# Patient Record
Sex: Male | Born: 1995 | Race: White | Hispanic: No | Marital: Single | Smoking: Never smoker
Health system: Southern US, Community
[De-identification: ages and names within clinical notes are randomized; demographics above are authoritative.]

## PROBLEM LIST (undated history)

## (undated) DIAGNOSIS — F329 Major depressive disorder, single episode, unspecified: Secondary | ICD-10-CM

## (undated) DIAGNOSIS — F32A Depression, unspecified: Secondary | ICD-10-CM

---

## 2013-02-13 ENCOUNTER — Emergency Department: Payer: Self-pay | Admitting: Emergency Medicine

## 2013-02-13 LAB — COMPREHENSIVE METABOLIC PANEL
Alkaline Phosphatase: 99 U/L (ref 98–317)
Anion Gap: 9 (ref 7–16)
BUN: 8 mg/dL (ref 7–18)
Chloride: 110 mmol/L — ABNORMAL HIGH (ref 98–107)
Co2: 24 mmol/L (ref 21–32)
Creatinine: 0.74 mg/dL (ref 0.60–1.30)
EGFR (African American): >60
EGFR (Non-African Amer.): >60
Osmolality: 285 (ref 275–301)
Potassium: 3.5 mmol/L (ref 3.5–5.1)
SGPT (ALT): 20 U/L (ref 12–78)
Sodium: 143 mmol/L (ref 136–145)

## 2013-02-13 LAB — ETHANOL
Ethanol %: 0.174 % — ABNORMAL HIGH (ref 0.000–0.080)
Ethanol: 174 mg/dL

## 2013-02-13 LAB — CBC
HGB: 12.7 g/dL — ABNORMAL LOW (ref 13.0–18.0)
MCH: 25.6 pg — ABNORMAL LOW (ref 26.0–34.0)
MCHC: 33.8 g/dL (ref 32.0–36.0)
RBC: 4.96 10*6/uL (ref 4.40–5.90)
WBC: 16.1 10*3/uL — ABNORMAL HIGH (ref 3.8–10.6)

## 2013-02-16 ENCOUNTER — Ambulatory Visit: Payer: Self-pay | Admitting: Psychiatry

## 2013-02-16 LAB — RAPID URINE DRUG SCREEN, HOSP PERFORMED
Amphetamines, Ur Screen: NEGATIVE (ref ?–1000)
Barbiturates, Ur Screen: NEGATIVE (ref ?–200)
Cocaine Metabolite,Ur ~~LOC~~: NEGATIVE (ref ?–300)
Opiate, Ur Screen: NEGATIVE (ref ?–300)

## 2013-05-25 ENCOUNTER — Ambulatory Visit: Payer: Self-pay | Admitting: Family Medicine

## 2013-08-15 ENCOUNTER — Emergency Department: Payer: Self-pay | Admitting: Emergency Medicine

## 2013-08-15 LAB — URINALYSIS, COMPLETE
BLOOD: NEGATIVE
Bilirubin,UR: NEGATIVE
Glucose,UR: NEGATIVE mg/dL (ref 0–75)
Ketone: NEGATIVE
Leukocyte Esterase: NEGATIVE
Nitrite: NEGATIVE
Ph: 7 (ref 4.5–8.0)
Protein: 30
RBC,UR: 5 /HPF (ref 0–5)
SPECIFIC GRAVITY: 1.025 (ref 1.003–1.030)
WBC UR: 2 /HPF (ref 0–5)

## 2013-08-15 LAB — CBC WITH DIFFERENTIAL/PLATELET
Basophil #: 0.1 10*3/uL (ref 0.0–0.1)
Basophil %: 0.6 %
EOS ABS: 0.4 10*3/uL (ref 0.0–0.7)
Eosinophil %: 3.2 %
HCT: 41.1 % (ref 40.0–52.0)
HGB: 13.5 g/dL (ref 13.0–18.0)
LYMPHS ABS: 2.2 10*3/uL (ref 1.0–3.6)
Lymphocyte %: 16.5 %
MCH: 25.9 pg — ABNORMAL LOW (ref 26.0–34.0)
MCHC: 32.9 g/dL (ref 32.0–36.0)
MCV: 79 fL — ABNORMAL LOW (ref 80–100)
MONO ABS: 0.7 x10 3/mm (ref 0.2–1.0)
Monocyte %: 5.1 %
Neutrophil #: 9.8 10*3/uL — ABNORMAL HIGH (ref 1.4–6.5)
Neutrophil %: 74.6 %
Platelet: 247 10*3/uL (ref 150–440)
RBC: 5.21 10*6/uL (ref 4.40–5.90)
RDW: 15.2 % — AB (ref 11.5–14.5)
WBC: 13.2 10*3/uL — AB (ref 3.8–10.6)

## 2013-08-15 LAB — COMPREHENSIVE METABOLIC PANEL
ALK PHOS: 112 U/L
AST: 23 U/L (ref 10–41)
Albumin: 4.3 g/dL (ref 3.8–5.6)
Anion Gap: 5 — ABNORMAL LOW (ref 7–16)
BUN: 9 mg/dL (ref 7–18)
Bilirubin,Total: 0.9 mg/dL (ref 0.2–1.0)
CO2: 29 mmol/L (ref 21–32)
CREATININE: 0.81 mg/dL (ref 0.60–1.30)
Calcium, Total: 8.8 mg/dL — ABNORMAL LOW (ref 9.0–10.7)
Chloride: 108 mmol/L — ABNORMAL HIGH (ref 98–107)
EGFR (African American): >60
EGFR (Non-African Amer.): >60
Glucose: 115 mg/dL — ABNORMAL HIGH (ref 65–99)
OSMOLALITY: 283 (ref 275–301)
POTASSIUM: 3.9 mmol/L (ref 3.5–5.1)
SGPT (ALT): 19 U/L (ref 12–78)
Sodium: 142 mmol/L (ref 136–145)
TOTAL PROTEIN: 7.3 g/dL (ref 6.4–8.6)

## 2013-08-15 LAB — ETHANOL: Ethanol %: <0.003 % (ref 0.000–0.080)

## 2013-08-15 LAB — LIPASE, BLOOD: Lipase: 185 U/L (ref 73–393)

## 2015-01-29 IMAGING — CR RIGHT MIDDLE FINGER 2+V
1 series · 3 of 3 positions shown · non-contrast
Comparison: No prior .

CLINICAL DATA: Trauma.

EXAM:
RIGHT MIDDLE FINGER 2+V

[Series 1: pa · 0.17mm/px · 3 of 3 slices shown]
[im 1/3]
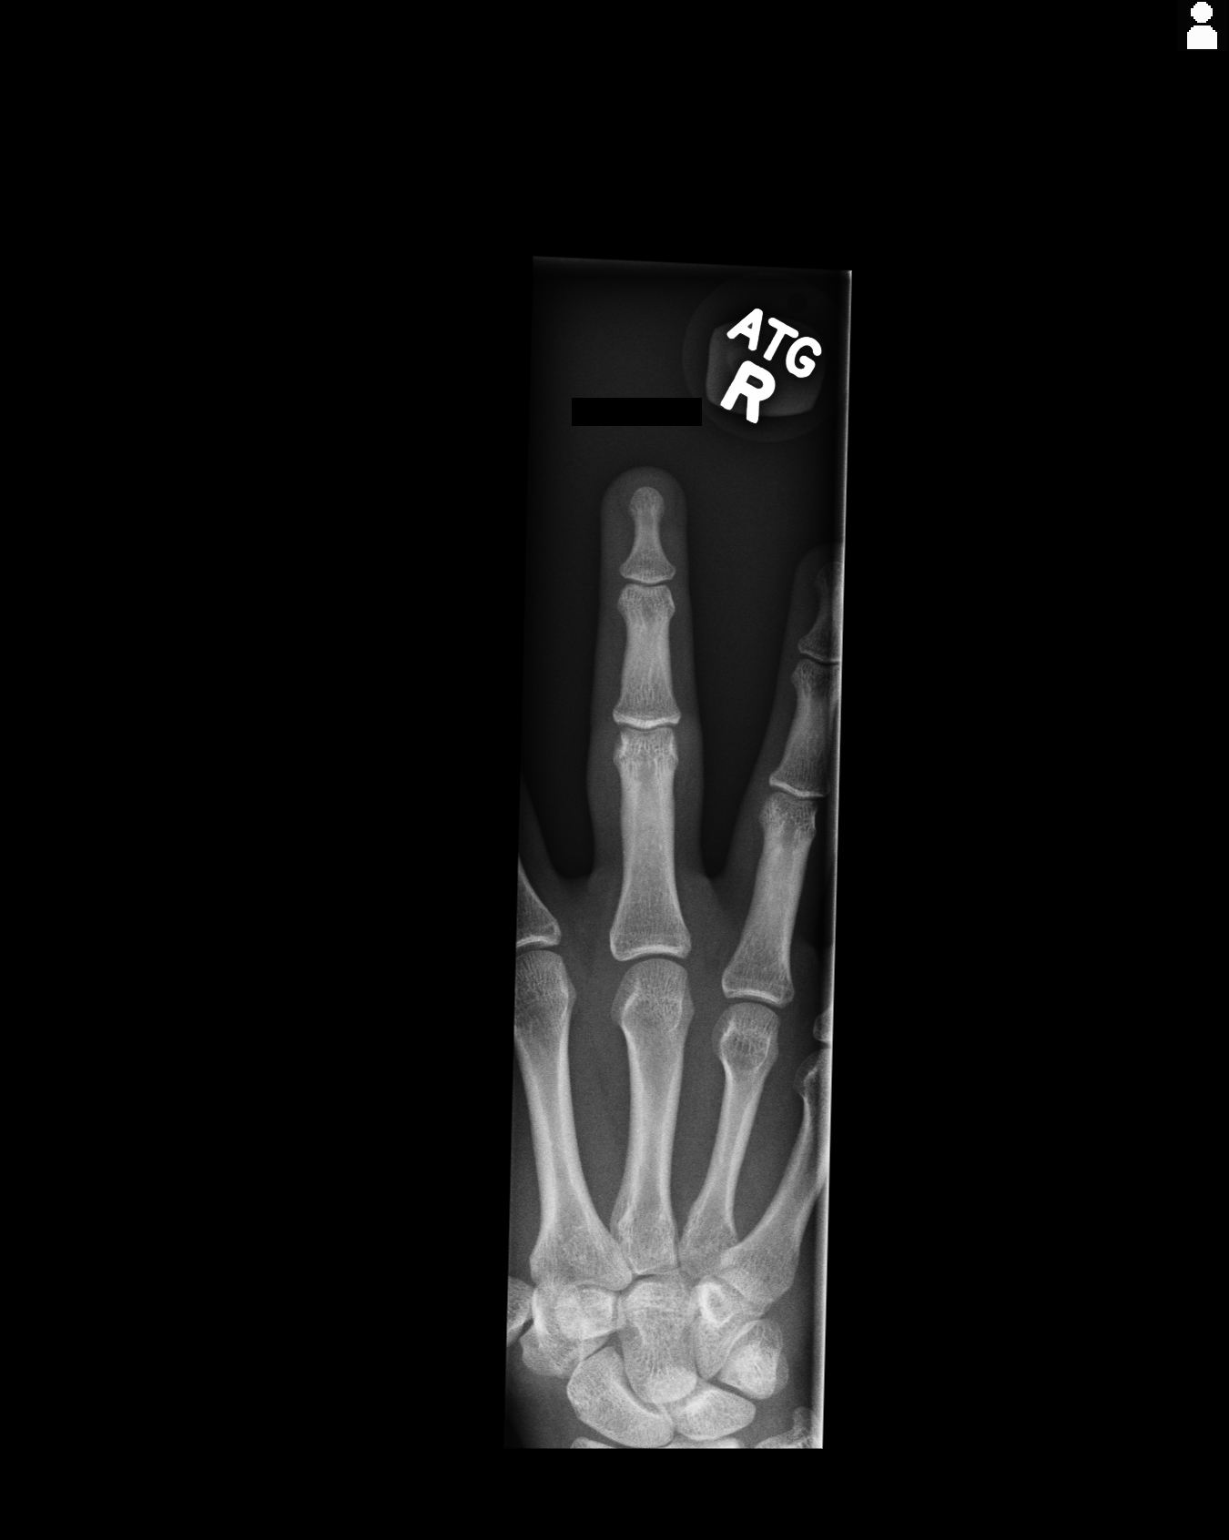
[im 2/3]
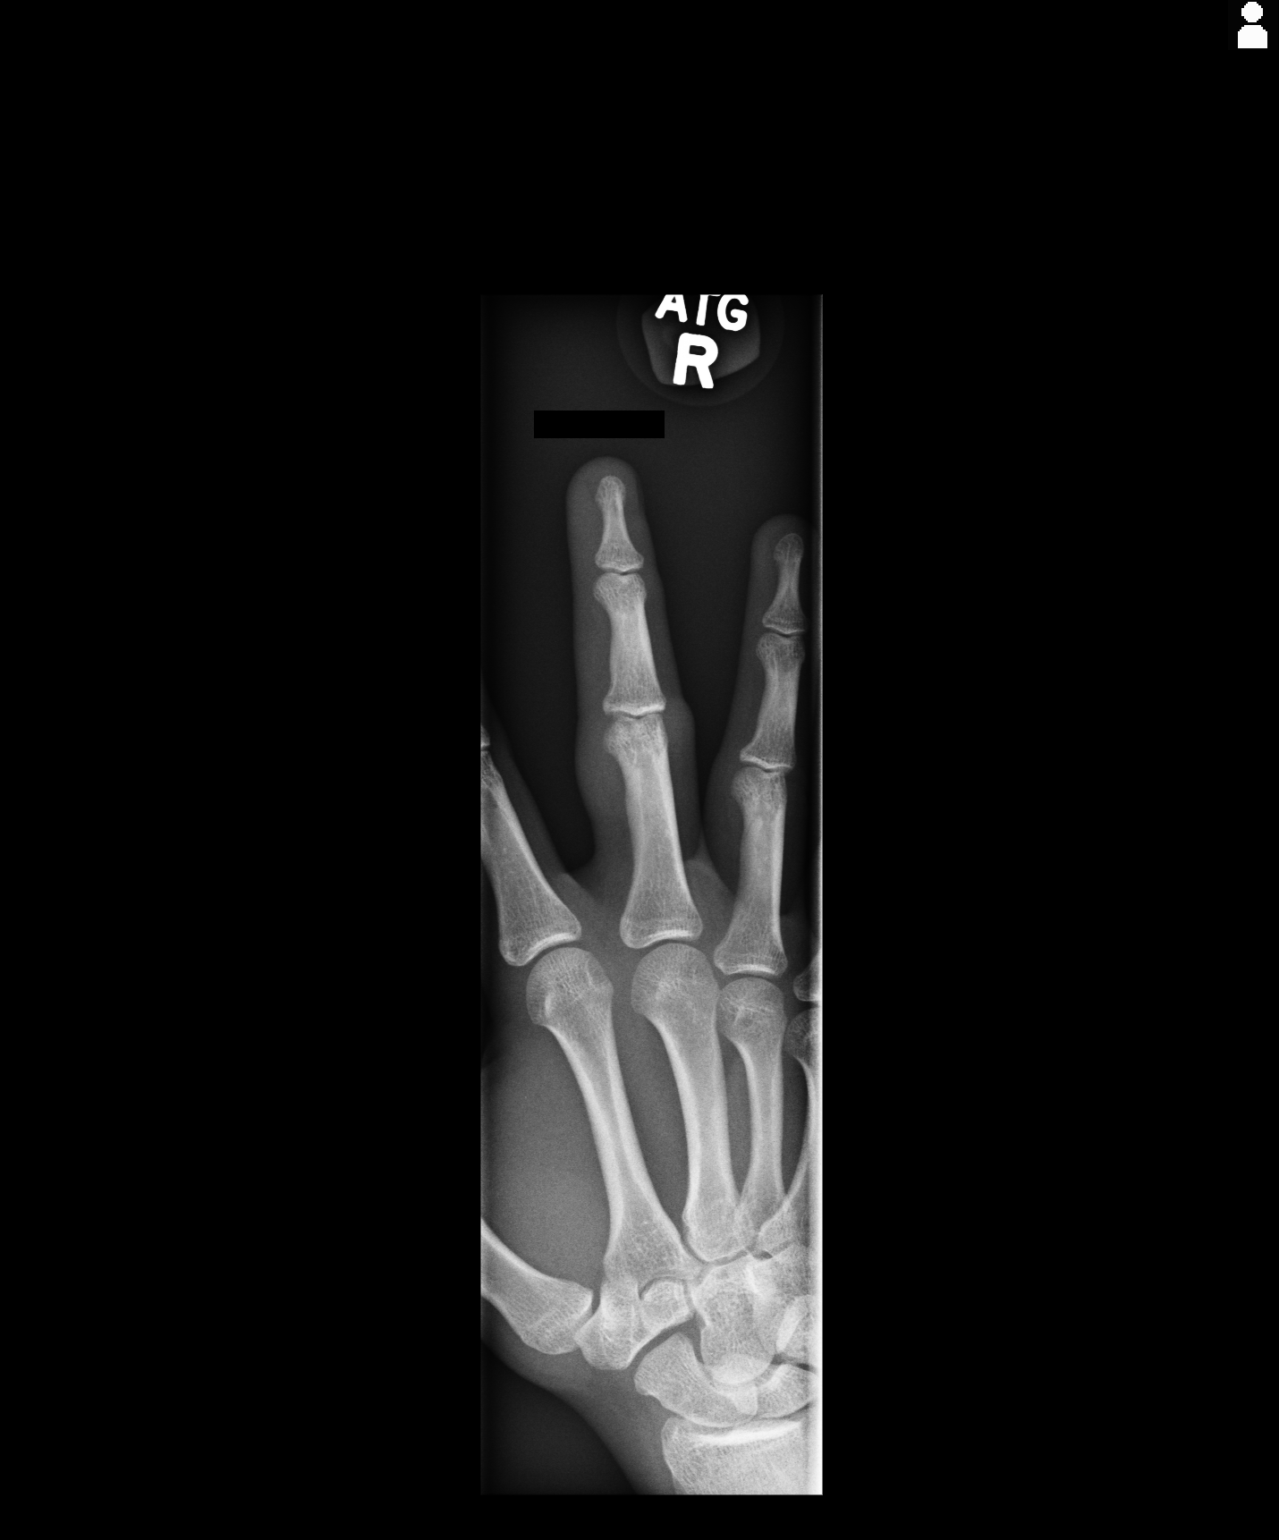
[im 3/3]
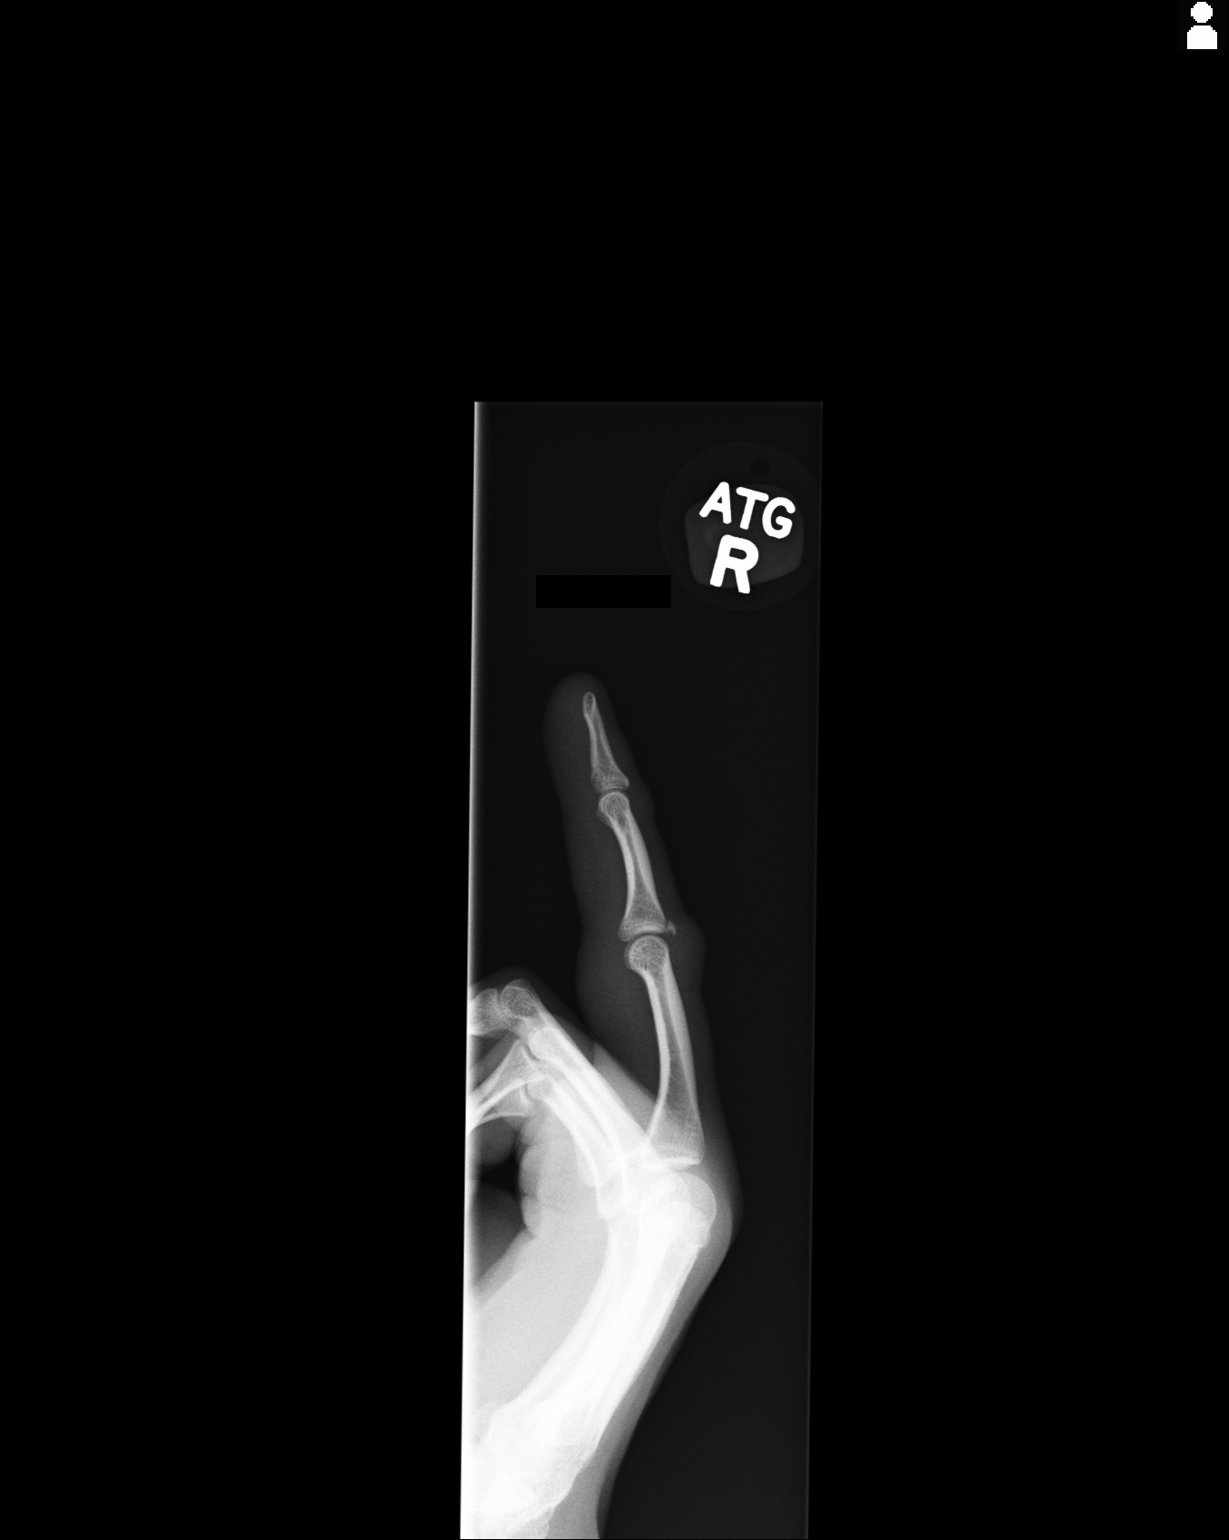

[3 of 3 positions shown; findings below may reference images not displayed]

FINDINGS: Slightly displaced small fracture of the posterior aspect of the
base of the middle phalanx of the right third digit is noted. No
dislocation. No foreign body.
IMPRESSION: Slightly displaced small fracture of the posterior aspect of the
base of the middle phalanx of the right third digit.

## 2015-06-16 ENCOUNTER — Encounter: Payer: Self-pay | Admitting: Emergency Medicine

## 2015-06-16 ENCOUNTER — Emergency Department
Admission: EM | Admit: 2015-06-16 | Discharge: 2015-06-16 | Disposition: A | Discharge status: Home / Self Care | Payer: PRIVATE HEALTH INSURANCE | Attending: Emergency Medicine | Admitting: Emergency Medicine

## 2015-06-16 DIAGNOSIS — A0811 Acute gastroenteropathy due to Norwalk agent: Secondary | ICD-10-CM | POA: Diagnosis not present

## 2015-06-16 DIAGNOSIS — R112 Nausea with vomiting, unspecified: Secondary | ICD-10-CM | POA: Diagnosis present

## 2015-06-16 HISTORY — DX: Major depressive disorder, single episode, unspecified: F32.9

## 2015-06-16 HISTORY — DX: Depression, unspecified: F32.A

## 2015-06-16 LAB — COMPREHENSIVE METABOLIC PANEL
ALT: 21 U/L (ref 17–63)
ANION GAP: 10 (ref 5–15)
AST: 21 U/L (ref 15–41)
Albumin: 4.8 g/dL (ref 3.5–5.0)
Alkaline Phosphatase: 79 U/L (ref 38–126)
BILIRUBIN TOTAL: 2 mg/dL — AB (ref 0.3–1.2)
BUN: 11 mg/dL (ref 6–20)
CHLORIDE: 105 mmol/L (ref 101–111)
CO2: 25 mmol/L (ref 22–32)
Calcium: 9.5 mg/dL (ref 8.9–10.3)
Creatinine, Ser: 0.74 mg/dL (ref 0.61–1.24)
Glucose, Bld: 93 mg/dL (ref 65–99)
POTASSIUM: 3.8 mmol/L (ref 3.5–5.1)
Sodium: 140 mmol/L (ref 135–145)
TOTAL PROTEIN: 7.5 g/dL (ref 6.5–8.1)

## 2015-06-16 LAB — CBC WITH DIFFERENTIAL/PLATELET
BASOS ABS: 0.1 10*3/uL (ref 0–0.1)
Basophils Relative: 1 %
EOS PCT: 6 %
Eosinophils Absolute: 0.6 10*3/uL (ref 0–0.7)
HEMATOCRIT: 44.9 % (ref 40.0–52.0)
Hemoglobin: 15.4 g/dL (ref 13.0–18.0)
LYMPHS PCT: 24 %
Lymphs Abs: 2.7 10*3/uL (ref 1.0–3.6)
MCH: 29 pg (ref 26.0–34.0)
MCHC: 34.4 g/dL (ref 32.0–36.0)
MCV: 84.4 fL (ref 80.0–100.0)
MONO ABS: 0.8 10*3/uL (ref 0.2–1.0)
MONOS PCT: 7 %
NEUTROS ABS: 6.8 10*3/uL — AB (ref 1.4–6.5)
Neutrophils Relative %: 62 %
PLATELETS: 247 10*3/uL (ref 150–440)
RBC: 5.32 MIL/uL (ref 4.40–5.90)
RDW: 12.9 % (ref 11.5–14.5)
WBC: 10.9 10*3/uL — ABNORMAL HIGH (ref 3.8–10.6)

## 2015-06-16 LAB — URINALYSIS COMPLETE WITH MICROSCOPIC (ARMC ONLY)
BILIRUBIN URINE: NEGATIVE
Bacteria, UA: NONE SEEN
GLUCOSE, UA: NEGATIVE mg/dL
HGB URINE DIPSTICK: NEGATIVE
KETONES UR: NEGATIVE mg/dL
LEUKOCYTES UA: NEGATIVE
NITRITE: NEGATIVE
PH: 8 (ref 5.0–8.0)
Protein, ur: NEGATIVE mg/dL
SPECIFIC GRAVITY, URINE: 1.006 (ref 1.005–1.030)

## 2015-06-16 LAB — LIPASE, BLOOD: LIPASE: 27 U/L (ref 11–51)

## 2015-06-16 MED ORDER — SODIUM CHLORIDE 0.9 % IV BOLUS (SEPSIS)
1000.0000 mL | Freq: Once | INTRAVENOUS | Status: AC
  Administered 2015-06-16: 1000 mL via INTRAVENOUS

## 2015-06-16 MED ORDER — ONDANSETRON 4 MG PO TBDP: 4.0000 mg | ORAL_TABLET | Freq: Three times a day (TID) | ORAL | Status: AC | PRN

## 2015-06-16 MED ORDER — LOPERAMIDE HCL 2 MG PO TABS: 2.0000 mg | ORAL_TABLET | Freq: Four times a day (QID) | ORAL | Status: AC | PRN

## 2015-06-16 MED ORDER — ONDANSETRON HCL 4 MG/2ML IJ SOLN
4.0000 mg | Freq: Once | INTRAMUSCULAR | Status: AC
  Administered 2015-06-16: 4 mg via INTRAVENOUS
  Filled 2015-06-16: qty 2

## 2015-06-16 NOTE — ED Notes (Signed)
Pt to ed with c/o nausea, vomiting x 3 days.  Pt reports 3 episodes of vomiting in the last 24 hours.  Pt denies fever but states body aches.

## 2015-06-16 NOTE — ED Provider Notes (Signed)
[facility] Emergency Department Provider Note  ____________________________________________  Time seen: Approximately 2:04 PM  I have reviewed the triage vital signs and the nursing notes.   HISTORY  Chief Complaint Vomiting    HPI Jeremy Lowe is a 20 y.o. male who presents to emergency department complaining of nausea, vomiting, diarrhea 3 days. Patient states that symptoms began insidiously but have persisted over the last 3 days. Patient states that yesterday he was unable to keep down solids or liquids. He denies any abdominal pain. He denies any bilious vomit. He denies any hematochezia or hematemesis. Patient states that he has had some subjective tactile fever and chills intermittently over the last few days.   Past Medical History  Diagnosis Date  . Depression     There are no active problems to display for this patient.   History reviewed. No pertinent past surgical history.  Current Outpatient Rx  Name  Route  Sig  Dispense  Refill  . loperamide (IMODIUM A-D) 2 MG tablet   Oral   Take 1 tablet (2 mg total) by mouth 4 (four) times daily as needed for diarrhea or loose stools.   30 tablet   0   . ondansetron (ZOFRAN-ODT) 4 MG disintegrating tablet   Oral   Take 1 tablet (4 mg total) by mouth every 8 (eight) hours as needed for nausea or vomiting.   20 tablet   0     Allergies Sulfa antibiotics  History reviewed. No pertinent family history.  Social History Social History  Substance Use Topics  . Smoking status: Never Smoker   . Smokeless tobacco: None  . Alcohol Use: Yes     Review of Systems  Constitutional: Subjective fever/chills Eyes: No visual changes. No discharge ENT: No sore throat. Denies nasal congestion. Cardiovascular: no chest pain. Respiratory: no cough. No SOB. Gastrointestinal: No abdominal pain.  Positive for nausea and vomiting. Positive for diarrhea.  Genitourinary: Negative for dysuria. No  hematuria Musculoskeletal: Positive for lower back pain. Skin: Negative for rash. Neurological: Negative for headaches, focal weakness or numbness. 10-point ROS otherwise negative.  ____________________________________________   PHYSICAL EXAM:  VITAL SIGNS: ED Triage Vitals  Enc Vitals Group     BP 06/16/15 1318 126/83 mmHg     Pulse Rate 06/16/15 1318 97     Resp 06/16/15 1318 20     Temp 06/16/15 1318 98.4 F (36.9 C)     Temp Source 06/16/15 1318 Oral     SpO2 06/16/15 1318 96 %     Weight 06/16/15 1318 137 lb (62.143 kg)     Height 06/16/15 1318  (1.727 m)     Head Cir --      Peak Flow --      Pain Score 06/16/15 1318 7     Pain Loc --      Pain Edu? --      Excl. in GC? --      Constitutional: Alert and oriented. Well appearing and in no acute distress. Eyes: Conjunctivae are normal. PERRL. EOMI. Head: Atraumatic. ENT:      Ears: EACs and TMs are unremarkable bilaterally.      Nose: No congestion/rhinnorhea.      Mouth/Throat: Mucous membranes are moist.  Neck: No stridor.   Hematological/Lymphatic/Immunilogical: No cervical lymphadenopathy. Cardiovascular: Normal rate, regular rhythm. Normal S1 and S2.  Good peripheral circulation. Respiratory: Normal respiratory effort without tachypnea or retractions. Lungs CTAB. Gastrointestinal: Bowel sounds 4 quadrants. Soft and nontender to  palpation. No rigidity. No guarding. No masses.. No distention. No CVA tenderness. Neurologic:  Normal speech and language. No gross focal neurologic deficits are appreciated.  Skin:  Skin is warm, dry and intact. No rash noted. Psychiatric: Mood and affect are normal. Speech and behavior are normal. Patient exhibits appropriate insight and judgement.   ____________________________________________   LABS (all labs ordered are listed, but only abnormal results are displayed)  Labs Reviewed  CBC WITH DIFFERENTIAL/PLATELET - Abnormal; Notable for the following:    WBC 10.9  (*)    Neutro Abs 6.8 (*)    All other components within normal limits  COMPREHENSIVE METABOLIC PANEL - Abnormal; Notable for the following:    Total Bilirubin 2.0 (*)    All other components within normal limits  URINALYSIS COMPLETEWITH MICROSCOPIC ([facility] ONLY) - Abnormal; Notable for the following:    Color, Urine YELLOW (*)    APPearance CLOUDY (*)    Squamous Epithelial / LPF 0-5 (*)    All other components within normal limits  LIPASE, BLOOD   ____________________________________________  EKG   ____________________________________________  RADIOLOGY   No results found.  ____________________________________________    PROCEDURES  Procedure(s) performed:       Medications  sodium chloride 0.9 % bolus 1,000 mL (0 mLs Intravenous Stopped 06/16/15 1443)  ondansetron (ZOFRAN) injection 4 mg (4 mg Intravenous Given 06/16/15 1353)  sodium chloride 0.9 % bolus 1,000 mL (1,000 mLs Intravenous New Bag/Given 06/16/15 1501)     ____________________________________________   INITIAL IMPRESSION / ASSESSMENT AND PLAN / ED COURSE  Pertinent labs & imaging results that were available during my care of the patient were reviewed by me and considered in my medical decision making (see chart for details).  Patient's diagnosis is consistent with normal virus. Patient was given IV fluid replenishment. He is given Zofran by IV and was able to keep down solids and liquids here in the emergency department. Laboratory results revealed no acute abnormalities.. Patient will be discharged home with prescriptions for Zofran and loperamide for symptom relief.. Patient is to follow up with primary care provider if symptoms persist past this treatment course. Patient is given ED precautions to return to the ED for any worsening or new symptoms.  Patient care is turned over to Cranston Neighbor, PA-C to await results of urinalysis. Any further management or diagnosis will be undertaken by this  provider.    ____________________________________________  FINAL CLINICAL IMPRESSION(S) / ED DIAGNOSES  Final diagnoses:  Norovirus      NEW MEDICATIONS STARTED DURING THIS VISIT:  New Prescriptions   LOPERAMIDE (IMODIUM A-D) 2 MG TABLET    Take 1 tablet (2 mg total) by mouth 4 (four) times daily as needed for diarrhea or loose stools.   ONDANSETRON (ZOFRAN-ODT) 4 MG DISINTEGRATING TABLET    Take 1 tablet (4 mg total) by mouth every 8 (eight) hours as needed for nausea or vomiting.        Delorise Royals Adina Puzzo, PA-C 06/16/15 1544  Jene Every, MD 06/16/15 [phone]

## 2015-06-16 NOTE — Discharge Instructions (Signed)
Norovirus Infection A norovirus infection is caused by exposure to a virus in a group of similar viruses (noroviruses). This type of infection causes inflammation in your stomach and intestines (gastroenteritis). Norovirus is the most common cause of gastroenteritis. It also causes food poisoning. Anyone can get a norovirus infection. It spreads very easily (contagious). You can get it from contaminated food, water, surfaces, or other people. Norovirus is found in the stool or vomit of infected people. You can spread the infection as soon as you feel sick until 2 weeks after you recover.  Symptoms usually begin within 2 days after you become infected. Most norovirus symptoms affect the digestive system. CAUSES Norovirus infection is caused by contact with norovirus. You can catch norovirus if you:  Eat or drink something contaminated with norovirus.  Touch surfaces or objects contaminated with norovirus and then put your hand in your mouth.  Have direct contact with an infected person who has symptoms.  Share food, drink, or utensils with someone with who is sick with norovirus. SIGNS AND SYMPTOMS Symptoms of norovirus may include:  Nausea.  Vomiting.  Diarrhea.  Stomach cramps.  Fever.  Chills.  Headache.  Muscle aches.  Tiredness. DIAGNOSIS Your health care provider may suspect norovirus based on your symptoms and physical exam. Your health care provider may also test a sample of your stool or vomit for the virus.  TREATMENT There is no specific treatment for norovirus. Most people get better without treatment in about 2 days. HOME CARE INSTRUCTIONS  Replace lost fluids by drinking plenty of water or rehydration fluids containing important minerals called electrolytes. This prevents dehydration. Drink enough fluid to keep your urine clear or pale yellow.  Do not prepare food for others while you are infected. Wait at least 3 days after recovering from the illness to do  that. PREVENTION   Wash your hands often, especially after using the toilet or changing a diaper.  Wash fruits and vegetables thoroughly before preparing or serving them.  Throw out any food that a sick person may have touched.  Disinfect contaminated surfaces immediately after someone in the household has been sick. Use a bleach-based household cleaner.  Immediately remove and wash soiled clothes or sheets. SEEK MEDICAL CARE IF:  Your vomiting, diarrhea, and stomach pain is getting worse.  Your symptoms of norovirus do not go away after 2-3 days. SEEK IMMEDIATE MEDICAL CARE IF:  You develop symptoms of dehydration that do not improve with fluid replacement. This may include:  Excessive sleepiness.  Lack of tears.  Dry mouth.  Dizziness when standing.  Weak pulse.   This information is not intended to replace advice given to you by your health care provider. Make sure you discuss any questions you have with your health care provider.   Document Released: 07/19/2002 Document Revised: 05/19/2014 Document Reviewed: 10/06/2013 Elsevier Interactive Patient Education 2016 [employer] Choices to Help Relieve Diarrhea, Adult When you have diarrhea, the foods you eat and your eating habits are very important. Choosing the right foods and drinks can help relieve diarrhea. Also, because diarrhea can last up to 7 days, you need to replace lost fluids and electrolytes (such as sodium, potassium, and chloride) in order to help prevent dehydration.  WHAT GENERAL GUIDELINES DO I NEED TO FOLLOW?  Slowly drink 1 cup (8 oz) of fluid for each episode of diarrhea. If you are getting enough fluid, your urine will be clear or pale yellow.  Eat starchy foods. Some good choices  include white rice, white toast, pasta, low-fiber cereal, baked potatoes (without the skin), saltine crackers, and bagels.  Avoid large servings of any cooked vegetables.  Limit fruit to two servings per day. A  serving is  cup or 1 small piece.  Choose foods with less than 2 g of fiber per serving.  Limit fats to less than 8 tsp (38 g) per day.  Avoid fried foods.  Eat foods that have probiotics in them. Probiotics can be found in certain dairy products.  Avoid foods and beverages that may increase the speed at which food moves through the stomach and intestines (gastrointestinal tract). Things to avoid include:  High-fiber foods, such as dried fruit, raw fruits and vegetables, nuts, seeds, and whole grain foods.  Spicy foods and high-fat foods.  Foods and beverages sweetened with high-fructose corn syrup, honey, or sugar alcohols such as xylitol, sorbitol, and mannitol. WHAT FOODS ARE RECOMMENDED? Grains White rice. White, [place], or pita breads (fresh or toasted), including plain rolls, buns, or bagels. White pasta. Saltine, soda, or graham crackers. Pretzels. Low-fiber cereal. Cooked cereals made with water (such as cornmeal, farina, or cream cereals). Plain muffins. Matzo. Melba toast. Zwieback.  Vegetables Potatoes (without the skin). Strained tomato and vegetable juices. Most well-cooked and canned vegetables without seeds. Tender lettuce. Fruits Cooked or canned applesauce, apricots, cherries, fruit cocktail, grapefruit, peaches, pears, or plums. Fresh bananas, apples without skin, cherries, grapes, cantaloupe, grapefruit, peaches, oranges, or plums.  Meat and Other Protein Products Baked or boiled chicken. Eggs. Tofu. Fish. Seafood. Smooth peanut butter. Ground or well-cooked tender beef, ham, veal, lamb, pork, or poultry.  Dairy Plain yogurt, kefir, and unsweetened liquid yogurt. Lactose-free milk, buttermilk, or soy milk. Plain hard cheese. Beverages Sport drinks. Clear broths. Diluted fruit juices (except prune). Regular, caffeine-free sodas such as ginger ale. Water. Decaffeinated teas. Oral rehydration solutions. Sugar-free beverages not sweetened with sugar  alcohols. Other Bouillon, broth, or soups made from recommended foods.  The items listed above may not be a complete list of recommended foods or beverages. Contact your dietitian for more options. WHAT FOODS ARE NOT RECOMMENDED? Grains Whole grain, whole wheat, bran, or rye breads, rolls, pastas, crackers, and cereals. Wild or brown rice. Cereals that contain more than 2 g of fiber per serving. Corn tortillas or taco shells. Cooked or dry oatmeal. Granola. Popcorn. Vegetables Raw vegetables. Cabbage, broccoli, Brussels sprouts, artichokes, baked beans, beet greens, corn, kale, legumes, peas, sweet potatoes, and yams. Potato skins. Cooked spinach and cabbage. Fruits Dried fruit, including raisins and dates. Raw fruits. Stewed or dried prunes. Fresh apples with skin, apricots, mangoes, pears, raspberries, and strawberries.  Meat and Other Protein Products Chunky peanut butter. Nuts and seeds. Beans and lentils. Berniece Salines.  Dairy High-fat cheeses. Milk, chocolate milk, and beverages made with milk, such as milk shakes. Cream. Ice cream. Sweets and Desserts Sweet rolls, doughnuts, and sweet breads. Pancakes and waffles. Fats and Oils Butter. Cream sauces. Margarine. Salad oils. Plain salad dressings. Olives. Avocados.  Beverages Caffeinated beverages (such as coffee, tea, soda, or energy drinks). Alcoholic beverages. Fruit juices with pulp. Prune juice. Soft drinks sweetened with high-fructose corn syrup or sugar alcohols. Other Coconut. Hot sauce. Chili powder. Mayonnaise. Gravy. Cream-based or milk-based soups.  The items listed above may not be a complete list of foods and beverages to avoid. Contact your dietitian for more information. WHAT SHOULD I DO IF I BECOME DEHYDRATED? Diarrhea can sometimes lead to dehydration. Signs of dehydration include dark urine and dry  mouth and skin. If you think you are dehydrated, you should rehydrate with an oral rehydration solution. These solutions can be  purchased at pharmacies, retail stores, or online.  Drink -1 cup (120-240 mL) of oral rehydration solution each time you have an episode of diarrhea. If drinking this amount makes your diarrhea worse, try drinking smaller amounts more often. For example, drink 1-3 tsp (5-15 mL) every 5-10 minutes.  A general rule for staying hydrated is to drink 1-2 L of fluid per day. Talk to your health care provider about the specific amount you should be drinking each day. Drink enough fluids to keep your urine clear or pale yellow.   This information is not intended to replace advice given to you by your health care provider. Make sure you discuss any questions you have with your health care provider.   Document Released: 07/19/2003 Document Revised: 05/19/2014 Document Reviewed: 03/21/2013 Elsevier Interactive Patient Education [employer]
